# Patient Record
Sex: Female | Born: 1990 | Race: Black or African American | Hispanic: No | Smoking: Never smoker
Health system: Southern US, Community
[De-identification: ages and names within clinical notes are randomized; demographics above are authoritative.]

## PROBLEM LIST (undated history)

## (undated) HISTORY — PX: ECTOPIC PREGNANCY SURGERY: SHX613

---

## 2011-11-29 LAB — COMPREHENSIVE METABOLIC PANEL
Albumin: 3.3 g/dL — ABNORMAL LOW (ref 3.4–5.0)
Anion Gap: 15 (ref 7–16)
BUN: 12 mg/dL (ref 7–18)
Chloride: 106 mmol/L (ref 98–107)
EGFR (African American): 52 — ABNORMAL LOW
Glucose: 208 mg/dL — ABNORMAL HIGH (ref 65–99)
Osmolality: 287 (ref 275–301)
Potassium: 3 mmol/L — ABNORMAL LOW (ref 3.5–5.1)
SGOT(AST): 18 U/L (ref 15–37)
SGPT (ALT): 16 U/L (ref 12–78)
Sodium: 141 mmol/L (ref 136–145)
Total Protein: 7.4 g/dL (ref 6.4–8.2)

## 2011-11-29 LAB — CK TOTAL AND CKMB (NOT AT ARMC)
CK, Total: 171 U/L (ref 21–215)
CK-MB: 1.5 ng/mL (ref 0.5–3.6)

## 2011-11-29 LAB — CBC WITH DIFFERENTIAL/PLATELET
Basophil #: 0.1 10*3/uL (ref 0.0–0.1)
Basophil %: 0.4 %
Basophil %: 0.5 %
Eosinophil #: 0.2 10*3/uL (ref 0.0–0.7)
Eosinophil #: 0 10*3/uL (ref 0.0–0.7)
Eosinophil %: 0.2 %
HCT: 28.1 % — ABNORMAL LOW (ref 35.0–47.0)
HGB: 10.4 g/dL — ABNORMAL LOW (ref 12.0–16.0)
HGB: 8.9 g/dL — ABNORMAL LOW (ref 12.0–16.0)
Lymphocyte #: 4.5 10*3/uL — ABNORMAL HIGH (ref 1.0–3.6)
Lymphocyte #: 2.7 10*3/uL (ref 1.0–3.6)
Lymphocyte %: 19.2 %
MCH: 27.7 pg (ref 26.0–34.0)
MCH: 26.7 pg (ref 26.0–34.0)
MCHC: 32.9 g/dL (ref 32.0–36.0)
MCHC: 31.6 g/dL — ABNORMAL LOW (ref 32.0–36.0)
MCV: 84 fL (ref 80–100)
Monocyte #: 1.1 x10 3/mm — ABNORMAL HIGH (ref 0.2–0.9)
Monocyte #: 1 x10 3/mm — ABNORMAL HIGH (ref 0.2–0.9)
Neutrophil #: 23.2 10*3/uL — ABNORMAL HIGH (ref 1.4–6.5)
Neutrophil %: 74.8 %
Neutrophil %: 85.7 %
Platelet: 333 10*3/uL (ref 150–440)
RDW: 15 % — ABNORMAL HIGH (ref 11.5–14.5)
RDW: 15.1 % — ABNORMAL HIGH (ref 11.5–14.5)

## 2011-11-29 LAB — BASIC METABOLIC PANEL
Calcium, Total: 8.4 mg/dL — ABNORMAL LOW (ref 8.5–10.1)
Co2: 16 mmol/L — ABNORMAL LOW (ref 21–32)
Creatinine: 1.51 mg/dL — ABNORMAL HIGH (ref 0.60–1.30)
EGFR (African American): 56 — ABNORMAL LOW
EGFR (Non-African Amer.): 49 — ABNORMAL LOW
Glucose: 184 mg/dL — ABNORMAL HIGH (ref 65–99)
Sodium: 143 mmol/L (ref 136–145)

## 2011-11-29 LAB — LIPASE, BLOOD: Lipase: 110 U/L (ref 73–393)

## 2011-11-29 LAB — HCG, QUANTITATIVE, PREGNANCY: Beta Hcg, Quant.: 11863 m[IU]/mL — ABNORMAL HIGH

## 2011-11-29 LAB — TROPONIN I: Troponin-I: <0.02 ng/mL

## 2011-11-30 ENCOUNTER — Inpatient Hospital Stay: Payer: Self-pay | Admitting: Obstetrics and Gynecology

## 2011-11-30 LAB — BASIC METABOLIC PANEL
Anion Gap: 9 (ref 7–16)
BUN: 10 mg/dL (ref 7–18)
BUN: 7 mg/dL (ref 7–18)
Calcium, Total: 8 mg/dL — ABNORMAL LOW (ref 8.5–10.1)
Chloride: 111 mmol/L — ABNORMAL HIGH (ref 98–107)
Chloride: 109 mmol/L — ABNORMAL HIGH (ref 98–107)
Co2: 24 mmol/L (ref 21–32)
Creatinine: 0.77 mg/dL (ref 0.60–1.30)
Creatinine: 0.76 mg/dL (ref 0.60–1.30)
EGFR (African American): >60
EGFR (Non-African Amer.): >60
Potassium: 4.4 mmol/L (ref 3.5–5.1)
Potassium: 3.4 mmol/L — ABNORMAL LOW (ref 3.5–5.1)
Sodium: 142 mmol/L (ref 136–145)

## 2011-11-30 LAB — CBC WITH DIFFERENTIAL/PLATELET
Basophil #: 0 10*3/uL (ref 0.0–0.1)
Basophil %: 0.2 %
Eosinophil #: 0 10*3/uL (ref 0.0–0.7)
Eosinophil #: 0 10*3/uL (ref 0.0–0.7)
Eosinophil %: 0.2 %
Lymphocyte #: 1.8 10*3/uL (ref 1.0–3.6)
Lymphocyte #: 2.8 10*3/uL (ref 1.0–3.6)
Lymphocyte %: 9.5 %
Lymphocyte %: 14.6 %
MCH: 28.2 pg (ref 26.0–34.0)
MCH: 27.7 pg (ref 26.0–34.0)
MCHC: 33.2 g/dL (ref 32.0–36.0)
MCV: 84 fL (ref 80–100)
Monocyte #: 1.5 x10 3/mm — ABNORMAL HIGH (ref 0.2–0.9)
Monocyte %: 5.1 %
Neutrophil #: 16 10*3/uL — ABNORMAL HIGH (ref 1.4–6.5)
Neutrophil #: 14.9 10*3/uL — ABNORMAL HIGH (ref 1.4–6.5)
Neutrophil %: 85.3 %
Neutrophil %: 77.1 %
Platelet: 187 10*3/uL (ref 150–440)
Platelet: 160 10*3/uL (ref 150–440)
RBC: 2.93 10*6/uL — ABNORMAL LOW (ref 3.80–5.20)
RBC: 2.34 10*6/uL — ABNORMAL LOW (ref 3.80–5.20)
RDW: 15 % — ABNORMAL HIGH (ref 11.5–14.5)
RDW: 14.9 % — ABNORMAL HIGH (ref 11.5–14.5)
WBC: 18.7 10*3/uL — ABNORMAL HIGH (ref 3.6–11.0)

## 2011-11-30 LAB — DRUG SCREEN, URINE
Amphetamines, Ur Screen: NEGATIVE (ref ?–1000)
Barbiturates, Ur Screen: NEGATIVE (ref ?–200)
Benzodiazepine, Ur Scrn: POSITIVE (ref ?–200)
Cannabinoid 50 Ng, Ur ~~LOC~~: POSITIVE (ref ?–50)
MDMA (Ecstasy)Ur Screen: NEGATIVE (ref ?–500)
Methadone, Ur Screen: NEGATIVE (ref ?–300)
Opiate, Ur Screen: NEGATIVE (ref ?–300)
Phencyclidine (PCP) Ur S: NEGATIVE (ref ?–25)
Tricyclic, Ur Screen: NEGATIVE (ref ?–1000)

## 2011-11-30 LAB — URINALYSIS, COMPLETE
Bilirubin,UR: NEGATIVE
Glucose,UR: NEGATIVE mg/dL (ref 0–75)
Leukocyte Esterase: NEGATIVE
Nitrite: NEGATIVE
Ph: 5 (ref 4.5–8.0)
Protein: NEGATIVE
RBC,UR: 1 /HPF (ref 0–5)
Specific Gravity: 1.015 (ref 1.003–1.030)
Squamous Epithelial: <1

## 2011-12-02 LAB — PATHOLOGY REPORT

## 2013-10-08 ENCOUNTER — Encounter (HOSPITAL_COMMUNITY): Payer: Self-pay | Admitting: Emergency Medicine

## 2013-10-08 DIAGNOSIS — S99929A Unspecified injury of unspecified foot, initial encounter: Secondary | ICD-10-CM

## 2013-10-08 DIAGNOSIS — S99919A Unspecified injury of unspecified ankle, initial encounter: Secondary | ICD-10-CM

## 2013-10-08 DIAGNOSIS — S40029A Contusion of unspecified upper arm, initial encounter: Secondary | ICD-10-CM | POA: Insufficient documentation

## 2013-10-08 DIAGNOSIS — S8000XA Contusion of unspecified knee, initial encounter: Secondary | ICD-10-CM | POA: Insufficient documentation

## 2013-10-08 DIAGNOSIS — Y92410 Unspecified street and highway as the place of occurrence of the external cause: Secondary | ICD-10-CM | POA: Insufficient documentation

## 2013-10-08 DIAGNOSIS — S8990XA Unspecified injury of unspecified lower leg, initial encounter: Secondary | ICD-10-CM | POA: Insufficient documentation

## 2013-10-08 DIAGNOSIS — S20219A Contusion of unspecified front wall of thorax, initial encounter: Secondary | ICD-10-CM | POA: Insufficient documentation

## 2013-10-08 DIAGNOSIS — Y9389 Activity, other specified: Secondary | ICD-10-CM | POA: Insufficient documentation
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
## 2013-10-09 ENCOUNTER — Emergency Department (HOSPITAL_COMMUNITY): Payer: No Typology Code available for payment source

## 2013-10-09 ENCOUNTER — Emergency Department (HOSPITAL_COMMUNITY)
Admission: EM | Admit: 2013-10-09 | Discharge: 2013-10-09 | Disposition: A | Discharge status: Home / Self Care | Payer: No Typology Code available for payment source | Attending: Emergency Medicine | Admitting: Emergency Medicine

## 2013-10-09 DIAGNOSIS — S20212A Contusion of left front wall of thorax, initial encounter: Secondary | ICD-10-CM

## 2013-10-09 DIAGNOSIS — S8001XA Contusion of right knee, initial encounter: Secondary | ICD-10-CM

## 2013-10-09 DIAGNOSIS — S40021A Contusion of right upper arm, initial encounter: Secondary | ICD-10-CM

## 2013-10-09 MED ORDER — OXYCODONE-ACETAMINOPHEN 5-325 MG PO TABS
1.0000 | ORAL_TABLET | Freq: Once | ORAL | Status: AC
  Administered 2013-10-09: 1 via ORAL
  Filled 2013-10-09: qty 1

## 2013-10-09 MED ORDER — OXYCODONE-ACETAMINOPHEN 5-325 MG PO TABS: 1.0000 | ORAL_TABLET | ORAL | Status: DC | PRN

## 2013-10-09 MED ORDER — BACITRACIN ZINC 500 UNIT/GM EX OINT
TOPICAL_OINTMENT | CUTANEOUS | Status: AC
  Filled 2013-10-09: qty 1.8
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
## 2013-10-14 MED FILL — Oxycodone w/ Acetaminophen Tab 5-325 MG: ORAL | Qty: 6 | Status: AC

## 2014-06-13 NOTE — Op Note (Signed)
PATIENT NAME:  [name], Elizabeth Poole DATE OF BIRTH:  [birth year]  DATE OF PROCEDURE:  11/29/2011  PREOPERATIVE DIAGNOSIS: Ruptured ovarian cyst versus ruptured ectopic pregnancy.  POSTOPERATIVE DIAGNOSIS: Ruptured ectopic pregnancy of the right fallopian tube.   ANESTHESIA: General.   SURGEON: Jaelah Hauth G. Knowles-Jonas, MD   ESTIMATED BLOOD LOSS: 500 mL.   COMPLICATIONS: None.   FINDINGS: 500 mL of hemoperitoneum. No active bleeding. 2 cm mass in the midportion of the right fallopian tube.   SPECIMENS REMOVED: Products of conception and partial right salpingectomy.  ASSISTANT: None.   COMPLICATIONS: None.   DESCRIPTION OF PROCEDURE: The patient is a 24 year old female who presented with sudden onset of pain and drop in hematocrit with positive beta-hCG. She was taken to the operating room where she was prepped and draped in the usual sterile fashion in the dorsal supine position. A Pfannenstiel incision was made in the lower abdomen and retractors placed and the bowel was packed away. The ectopic pregnancy was noted to be in the right midportion of the fallopian tube and the area was dissected free by making a window in the broad ligament and doubly clamping on the proximal and distal side of the ectopic pregnancy. This was resected and handed off the field for permanent pathology. The fallopian tube was then suture ligatured with 2-0 Vicryl on the distal and proximal ends and hemostasis was excellent. The abdomen and pelvis was then copiously irrigated and cleansed free of all clots and debris. The dissection site in the right fallopian tube was examined and found to be hemostatic. The uterus was grossly normal as was the left tube, left ovary, and right ovary. The abdomen was then closed in the following fashion. The fascia was closed using 0 Vicryl in a running fashion starting at one end, closing in the middle, tying in the middle, and then starting at the opposite end and tying over  the middle. The subcutaneous tissues were copiously irrigated and the skin was closed with staples. A sterile bandage was placed.   The patient was awakened from anesthesia without difficulty and returned to the recovery room in stable condition. She was admitted to the hospital for further observation in stable condition.   ____________________________ Beryle QuantLynde G. Toya SmothersKnowles-Jonas, MD [id number] D: 12/08/2011 12:12:57 ET T: 12/08/2011 12:30:18 ET JOB#: [id number]  cc: Beryle QuantLynde G. Toya SmothersKnowles-Jonas, MD, <Dictator> Kayley Zeiders Toya SmothersKNOWLES JONAS MD ELECTRONICALLY SIGNED 12/08/2011 22:52

## 2014-06-13 NOTE — H&P (Signed)
Subjective/Chief Complaint possible ectopic pelvic pain    History of Present Illness 24yo G1P0 presents with pelvic pain.  Hcg [id number]. Pain severe this afternoon, became diaphoretic, brought to [facility] via ambulance, u/s shows pelvic mass and echogenic fluid    Past History PMH negative. Non smoker. No prior surgeries.    Primary Physician nne   ALLERGIES:  No Known Allergies:   Family and Social History:   Family History Non-Contributory    Social History negative tobacco, negative ETOH, negative Illicit drugs    Place of Living Home   Review of Systems:   Subjective/Chief Complaint pelvic pain    Fever/Chills No    Cough No    Sputum No    Abdominal Pain Yes    Diarrhea No    Constipation No    Nausea/Vomiting No    SOB/DOE No    Chest Pain No    Telemetry Reviewed EKG wnl    Dysuria No    Tolerating Diet Nauseated   Physical Exam:   GEN well developed, well nourished    HEENT PERRL, hearing intact to voice, Oropharynx clear, good dentition    NECK supple  No masses    RESP normal resp effort    CARD regular rate    ABD positive tenderness  no liver/spleen enlargement  no hernia  soft  hypoactive BS    GU foley catheter in place    LYMPH negative neck, negative axillae    EXTR negative cyanosis/clubbing, negative edema    SKIN No rashes, No ulcers    NEURO cranial nerves intact    PSYCH alert, A+O to time, place, person, good insight   Lab Results: Hepatic:  05-Oct-13 18:02    Bilirubin, Total 0.2   Alkaline Phosphatase 76   SGPT (ALT) 16   SGOT (AST) 18   Total Protein, Serum 7.4   Albumin, Serum  3.3  Routine Chem:  05-Oct-13 18:02    Glucose, Serum  184   Glucose, Serum  208   BUN 15   BUN 12   Creatinine (comp)  1.51   Creatinine (comp)  1.61   Sodium, Serum 143   Sodium, Serum 141   Potassium, Serum 3.6   Potassium, Serum  3.0   Chloride, Serum  110   Chloride, Serum 106   CO2, Serum  16   CO2, Serum  20    Calcium (Total), Serum  8.4   Calcium (Total), Serum 9.1   Anion Gap  17   Anion Gap 15   Osmolality (calc) 291   Osmolality (calc) 287   eGFR (African American)  56   eGFR (African American)  52   eGFR (Non-African American)  49 (eGFR values <37m/min/1.73 m2 may be an indication of chronic kidney disease (CKD). Calculated eGFR is useful in patients with stable renal function. The eGFR calculation will not be reliable in acutely ill patients when serum creatinine is changing rapidly. It is not useful in  patients on dialysis. The eGFR calculation may not be applicable to patients at the low and high extremes of body sizes, pregnant women, and vegetarians.)   eGFR (Non-African American)  45 (eGFR values <661mmin/1.73 m2 may be an indication of chronic kidney disease (CKD). Calculated eGFR is useful in patients with stable renal function. The eGFR calculation will not be reliable in acutely ill patients when serum creatinine is changing rapidly. It is not useful in  patients on dialysis. The eGFR calculation may not be  applicable to patients at the low and high extremes of body sizes, pregnant women, and vegetarians.)   Lipase 110 (Result(s) reported on 29 Nov 2011 at 06:48PM.)   HCG Betasubunit Quant. Serum  11863 (1-3  (International Unit)  ----------------- Non-pregnant <5 Weeks Post LMP mIU/mL  3- 4 wk 9 - 130  4- 5 wk 75 - 2,600  5- 6 wk 850 - 20,800  6- 7 wk 4,000 - 100,000  7-12 wk 11,500 - 289,000 12-16 wk 18,000 - 137,000 16-29 wk 1,400 - 53,000 29-41 wk 940 - 60,000)   Magnesium, Serum 1.8 (1.8-2.4 THERAPEUTIC RANGE: 4-7 mg/dL TOXIC: > 10 mg/dL  -----------------------)  Cardiac:  05-Oct-13 18:02    Troponin I < 0.02 (0.00-0.05 0.05 ng/mL or less: NEGATIVE  Repeat testing in 3-6 hrs  if clinically indicated. >0.05 ng/mL: POTENTIAL  MYOCARDIAL INJURY. Repeat  testing in 3-6 hrs if  clinically indicated. NOTE: An increase or decrease  of 30% or more on  serial  testing suggests a  clinically important change)   CK, Total 171   CPK-MB, Serum 1.5 (Result(s) reported on 29 Nov 2011 at 06:48PM.)  Routine Hem:  05-Oct-13 18:02    WBC (CBC)  27.0   WBC (CBC)  23.4   RBC (CBC)  3.34   RBC (CBC)  3.74   Hemoglobin (CBC)  8.9   Hemoglobin (CBC)  10.4   Hematocrit (CBC)  28.1   Hematocrit (CBC)  31.5   Platelet Count (CBC) 278   Platelet Count (CBC) 333   MCV 84   MCV 84   MCH 26.7   MCH 27.7   MCHC  31.6   MCHC 32.9   RDW  15.1   RDW  15.0   Neutrophil % 85.7   Neutrophil % 74.8   Lymphocyte % 9.9   Lymphocyte % 19.2   Monocyte % 3.7   Monocyte % 4.9   Eosinophil % 0.2   Eosinophil % 0.7   Basophil % 0.5   Basophil % 0.4   Neutrophil #  23.2   Neutrophil #  17.5   Lymphocyte # 2.7   Lymphocyte #  4.5   Monocyte #  1.0   Monocyte #  1.1   Eosinophil # 0.0   Eosinophil # 0.2   Basophil # 0.1 (Result(s) reported on 29 Nov 2011 at 09:00PM.)   Basophil # 0.1 (Result(s) reported on 29 Nov 2011 at [facility].)   Radiology Results: XRay:    05-Oct-13 20:34, Chest Portable Single View   Chest Portable Single View   REASON FOR EXAM:    abd pain  COMMENTS:       PROCEDURE: DXR - DXR PORTABLE CHEST SINGLE VIEW  - Nov 29 2011  8:34PM     RESULT: Comparison: None    Findings:     Single portable AP chest radiograph is provided.  There is no focal   parenchymal opacity, pleural effusion, or pneumothorax. Normal   cardiomediastinal silhouette. The osseous structures are unremarkable.    IMPRESSION:     No acute disease of the chest.    Dictation Site: 1          Verified By: Jennette Banker, M.D., MD    05-Oct-13 23:21, Chest Portable Single View   Chest Portable Single View   REASON FOR EXAM:    high peak air way pressures  COMMENTS:       PROCEDURE: DXR - DXR PORTABLE CHEST SINGLE VIEW  -  Nov 29 2011 11:21PM     RESULT: Comparison made to prior study of 11/29/2011. Endotracheal tube in   good position. Lungs  clear. Heart normal. No evidence of pneumothorax.    IMPRESSION:  Endotracheal tube in good position. No acute abnormality   noted.          Verified By: Osa Craver, M.D., MD  [place]:    05-Oct-13 19:59, US OB Less Than 14 Weeks   US OB Less Than 14 Weeks   REASON FOR EXAM:    Abdominal pain,  COMMENTS:   May transport without cardiac monitor    PROCEDURE: [place]  - US OB LESS THAN 14 WEEKS  - Nov 29 2011  7:59PM     RESULT: Size, shape, and echotexture of the uterus is normal. Endometrial   stripe is normal. A large amount of dense fluid noted about the uterus   and throughout the abdomen including below the liver. This is worrisome   for intraabdominal hemorrhage. Given the patient's positive pregnancy   test, this is worrisome for ruptured ectopic pregnancy and gynecologic   emergent consultation suggested. Ovaries not identified. No   hydronephrosis.    IMPRESSION:  Findings worrisome for intra-abdominal and pelvic hemorrhage   possibly from ruptured ectopic pregnancy as described above. Report     phoned to patient's physician at time of study.          Verified By: Osa Craver, M.D., MD     Assessment/Admission Diagnosis Pelvic pain Positive hcg possible ectopic    Plan to or     Admission/Visit Status,  Observation; -  Dr.'s Service: Riley Churches   Preferred Unit: Ob/Gyn   Diagnosis: Ruptured Ectopic/Hemmarhagic Cyst, 633.90 Ruptured ectopic pregnancy-Condition:Stable  Date of Admission: 29-Nov-2011, 29-Nov-2011, Active, Standard   *Discharge patient from PACU,  when PARS = 10 and Patient has been evaluated by Anesthesia, 29-Nov-2011, Completed, Standard   Basic Metabolic Panel (w/Total Calcium), STAT  Draw Date:29-Nov-2011, 29-Nov-2011, 1 or more Final Results Received, Standard   Blood Group and Rh, STAT  Draw Date:29-Nov-2011, 29-Nov-2011, Corrected Results, Standard   Cardiac Panel, STAT  Draw Date:29-Nov-2011  Includes CK Total and CPK-MB,  29-Nov-2011, 1 or more Final Results Received, Standard   CBC Profile, STAT  Draw Date:29-Nov-2011, 29-Nov-2011, 1 or more Final Results Received, Standard   CBC Profile, STAT  Draw Date:29-Nov-2011, 29-Nov-2011, 1 or more Final Results Received, Standard   Comprehensive Metabolic Panel, STAT  Draw Date:29-Nov-2011, 29-Nov-2011, 1 or more Final Results Received, Standard   HCG Betasubunit Quant. Serum, STAT  Draw Date:29-Nov-2011, 29-Nov-2011, 1 or more Final Results Received, Standard   Lipase, STAT  Draw Date:29-Nov-2011, 29-Nov-2011, 1 or more Final Results Received, Standard   Magnesium, Serum, STAT  Draw Date:29-Nov-2011, 29-Nov-2011, 1 or more Final Results Received, Standard   Troponin I, STAT  Draw Date:29-Nov-2011, 29-Nov-2011, 1 or more Final Results Received, Standard   Packed Cells _unit(s) avail at all times, # of Unit(s):4 This does not route to lab.  Lab requires type and screen, crossmatch, and Packed Cells trans fee., 29-Nov-2011, Active, Standard   Crossmatch 4 Units, STAT-Transfusion indication: Require blood STAT - Acute hemorrhage., 29-Nov-2011, Corrected Results, Standard   Type and Antibody Screen, Routine, 29-Nov-2011, Corrected Results, Standard   Basic Metabolic Panel (w/Total Calcium), Routine  Draw Date:30-Nov-2011, 30-Nov-2011, Active, Standard   CBC Profile, Routine  Draw Date:30-Nov-2011, 30-Nov-2011, Active, Standard   Pregnancy Test, Urine, STAT  Draw Date:29-Nov-2011, 29-Nov-2011, Cancelled, Standard  Urinalysis, STAT  Date to [place], 29-Nov-2011, Pending Collection, Standard   Urine Drug Screen, Qual, STAT  Draw Date:29-Nov-2011, 29-Nov-2011, Pending Collection, Standard   Sodium Chloride 0.9%, 1000 ml at 999 ml/hr, Stop After: 1 Doses, 29-Nov-2011, Completed, Standard   Lactated Ringers, 1000 ml at 999 ml/hr, Stop After: 1 Doses, 29-Nov-2011, Completed, Standard   Sodium Chloride 0.9%, 250 ml at 999 ml/hr, Instructions:  For blood administration-Per Policy, Stop After: 1 Doses, 29-Nov-2011, Completed, Standard   Lactated Ringers, 1000 ml at 125 ml/hr, 30-Nov-2011, Active, Standard   Potassium Chloride injection, 20 mEq in Sodium Chloride 0.9% 250 ml, IV Piggyback, once, Infuse over 2 hour(s)  Indication: Hypokalemia, 29-Nov-2011, Active, Standard   Ondansetron injection,  ( Zofran injection )  4 mg, IV push, once  Indication: Nausea/ Vomiting, 29-Nov-2011, Completed, Standard   Ondansetron injection,  ( Zofran injection )  4 mg, IV push, once  Indication: Nausea/ Vomiting, 29-Nov-2011, Completed, Standard   fentaNYL injection, ( Sublimaze injection )  12.5 to 25 mcg, IV push, every 5 - 10 minute(s) PRN for pain  Indication: Pain, -MAX TOTAL DOSAGE:    100   mcg.  PACU Only-------If pain is SEVERE and remains unresponsive to above, may increase MAX dosage to a TOTAL of 200 mcg. Contact md if pain level remains uncontrolled  [Med Admin Window: 30 mins before or after scheduled dose], 29-Nov-2011, Active, Standard   Meperidine injection, ( Demerol injection )  12.5 mg, IV push, every 5 - 10 minute(s) PRN for pain  Indication: Pain, PACU: {For SEVERE pain unresponsive to Fentanyl.} Demerol Max 50 mg.------- If pain remains SEVERE and unresponsive to above, may increase Demerol to a MAX of 100 mg Total.  [Med Admin Window: 30 mins before or after scheduled dose], 29-Nov-2011, Active, Standard   Ondansetron injection, ( Zofran injection )  4 mg, IV push, once PRN for nausea, vomiting.  Stop After:  1 Doses  Indication: Nausea, PACU Only, 29-Nov-2011, Active, Standard   MorphINE PCA, (1 mg/ml), PCA Dose: 1 mg, Lock Out Interval: 6 minutes, Four Hour Limit: 20 mg, Loading Dose: 0 mg, [Med Admin Window: 30 min before or after scheduled dose], 30-Nov-2011, Active, Standard   Ondansetron injection,  ( Zofran injection )  4 mg, IV push, q12h PRN for nausea  Indication: Nausea/Vomiting, 30-Nov-2011, Active,  Standard   US OB Less Than 14 Weeks w/ Transvaginal, Stat-STAT-Abdominal pain,; EXAM IN PROGRESS. DO NOT CANCEL.  May transport without cardiac monitor**After hours/weekends/holidays - the ordering physician must contact the Radiologist on call to have the study performed, 29-Nov-2011, Cancelled by Performing Department, Standard   US OB Less Than 14 Weeks, 18:44-STAT-Abdominal pain,, 29-Nov-2011, 1 or more Final Results Received, Standard   Chest Portable Single View, STAT-abd pain, 29-Nov-2011, 1 or more Final Results Received, Standard   Chest Portable Single View, Routine-high peak air way pressures, 29-Nov-2011, 1 or more Final Results Received, Standard   ED ECG, Stat-Interpreted by-.ordering physician-Reason for ecg -weakness, 29-Nov-2011, Completed, Standard   Oxygen per nasal cannula for sat <:, -Liters per [place]:2-if sat <:92-May discontinue in am if sat > 90, 29-Nov-2011, Active, Standard   PACU Oxygen, face tent  Liters per min: 15-Humidified  if patient has had General Anesthesia. Simple face mask may also be used., 29-Nov-2011, Completed Activation, Standard   PACU Oxygen, nasal cannula  Liters per min: 1-5  if patient has had MAC anesthesia., 29-Nov-2011, Available for Activation, Standard   PACU Oxygen, nasal cannula  Liters per min: 1-5  if patient has had Regional Anesthesia., 29-Nov-2011, Available for Activation, Standard   PACU Oxygen, face tent  Liters per min: 15-Humidified  if patient has had General Anesthesia. Simple face mask may also be used., 30-Nov-2011, Active, Standard   Clear Liquid Diet, Advance diet as tolerated to: Regular, 30-Nov-2011, Active, Standard   Pneumatic/Compression Hose, 30-Nov-2011, Active, Standard   Incentive Spirometry Q2hr W/A, Initiate Respiratory Therapy Protocol, 30-Nov-2011, Active, Standard   Foley, For Monitoring urinary output/hemodynamic instability, 29-Nov-2011, Active, Standard   In & Out Cath, prn for inability to  void/discomfort-As Spinal/Epidural Subsides., 29-Nov-2011, Active, Standard   Document:, urine output post catheter insertion on the I & O flowsheet, 29-Nov-2011, Active, Standard   Obtain Documentation of Consent for blood transfusion, 29-Nov-2011, Pending, Standard  Electronic Signatures: Elgie Collard (MD)  (Signed 06-Oct-13 02:46)  Authored: CHIEF COMPLAINT and HISTORY, ALLERGIES, FAMILY AND SOCIAL HISTORY, REVIEW OF SYSTEMS, PHYSICAL EXAM, LABS, Radiology, ASSESSMENT AND PLAN, Orders   Last Updated: 06-Oct-13 02:46 by Elgie Collard (MD)

## 2016-05-03 ENCOUNTER — Encounter (HOSPITAL_COMMUNITY): Payer: Self-pay | Admitting: Emergency Medicine

## 2016-05-03 ENCOUNTER — Emergency Department (HOSPITAL_COMMUNITY): Payer: Self-pay

## 2016-05-03 ENCOUNTER — Emergency Department (HOSPITAL_COMMUNITY)
Admission: EM | Admit: 2016-05-03 | Discharge: 2016-05-03 | Disposition: A | Discharge status: Home / Self Care | Payer: Self-pay | Attending: Emergency Medicine | Admitting: Emergency Medicine

## 2016-05-03 DIAGNOSIS — Y939 Activity, unspecified: Secondary | ICD-10-CM | POA: Insufficient documentation

## 2016-05-03 DIAGNOSIS — Y929 Unspecified place or not applicable: Secondary | ICD-10-CM | POA: Insufficient documentation

## 2016-05-03 DIAGNOSIS — W108XXA Fall (on) (from) other stairs and steps, initial encounter: Secondary | ICD-10-CM | POA: Insufficient documentation

## 2016-05-03 DIAGNOSIS — S8991XA Unspecified injury of right lower leg, initial encounter: Secondary | ICD-10-CM | POA: Insufficient documentation

## 2016-05-03 DIAGNOSIS — Y999 Unspecified external cause status: Secondary | ICD-10-CM | POA: Insufficient documentation

## 2016-05-03 MED ORDER — IBUPROFEN 600 MG PO TABS: 600.0000 mg | ORAL_TABLET | Freq: Four times a day (QID) | ORAL | 0 refills | Status: AC | PRN

## 2016-05-03 MED ORDER — HYDROCODONE-ACETAMINOPHEN 5-325 MG PO TABS
1.0000 | ORAL_TABLET | Freq: Once | ORAL | Status: AC
  Administered 2016-05-03: 1 via ORAL
  Filled 2016-05-03: qty 1

## 2016-05-03 MED ORDER — HYDROCODONE-ACETAMINOPHEN 5-325 MG PO TABS: 1.0000 | ORAL_TABLET | ORAL | 0 refills | Status: AC | PRN

## 2016-05-03 NOTE — ED Provider Notes (Signed)
[facility]-EMERG[facility]Y DEPT Provider Note   CSN: [id number] Arrival date & time: 05/03/16  0944     History   Chief Complaint Chief Complaint  Patient presents with  . Knee Injury    HPI Elizabeth Poole is a 26 y.o. female with no significant past medical history presenting with persistent Right medial knee pain and swelling since tripping and falling down 4 steps 2 days ago.  She has been applying ice, using elevation and rest in addition has taken Tylenol with no significant improvement in her symptoms.  She endorses episodes of a popping sensation with certain movements at her medial knee space.  She denies weakness or numbness distal to the injury site and denies any other injuries from this fall.  The history is provided by the patient.    History reviewed. No pertinent past medical history.  There are no active problems to display for this patient.   Past Surgical History:  Procedure Laterality Date  . ECTOPIC PREGNANCY SURGERY      OB History    No data available       Home Medications    Prior to Admission medications   Medication Sig Start Date End Date Taking? Authorizing Provider  HYDROcodone-acetaminophen (NORCO/VICODIN) 5-325 MG tablet Take 1 tablet by mouth every 4 (four) hours as needed. 05/03/16   Burgess Amor, PA-C  ibuprofen (ADVIL,MOTRIN) 600 MG tablet Take 1 tablet (600 mg total) by mouth every 6 (six) hours as needed. 05/03/16   Burgess Amor, PA-C    Family History History reviewed. No pertinent family history.  Social History Social History  Substance Use Topics  . Smoking status: Never Smoker  . Smokeless tobacco: Never Used  . Alcohol use Yes     Comment: every few days     Allergies   Patient has no known allergies.   Review of Systems Review of Systems  Constitutional: Negative for fever.  Musculoskeletal: Positive for arthralgias and joint swelling. Negative for myalgias.  Neurological: Negative for weakness and numbness.     Physical  Exam Updated Vital Signs BP 123/82 (BP Location: Left Arm)   Pulse 81   Temp 98.2 F (36.8 C) (Oral)   Resp 18   Ht 5\' 8"  (1.727 m)   Wt 101.6 kg   LMP 04/08/2016   SpO2 100%   BMI 34.06 kg/m   Physical Exam  Constitutional: She appears well-developed and well-nourished.  HENT:  Head: Atraumatic.  Neck: Normal range of motion.  Cardiovascular:  Pulses equal bilaterally  Musculoskeletal: She exhibits tenderness.       Right knee: She exhibits swelling. She exhibits no effusion, no ecchymosis, no deformity, no erythema, normal alignment, no LCL laxity and no MCL laxity. Tenderness found. Medial joint line tenderness noted.  Increased pain with varus right knee strain. Also ttp quadricep tendon, distally, no palpable deformity.  Pt can SLR with knee in extension.  Neurological: She is alert. She has normal strength. She displays normal reflexes. No sensory deficit.  Skin: Skin is warm and dry.  Psychiatric: She has a normal mood and affect.     ED Treatments / Results  Labs (all labs ordered are listed, but only abnormal results are displayed) Labs Reviewed  POC URINE PREG, ED    EKG  EKG Interpretation None       Radiology Dg Knee Complete 4 Views Right  Result Date: 05/03/2016 CLINICAL DATA:  Fall EXAM: RIGHT KNEE - COMPLETE 4+ VIEW COMPARISON:  10/09/2013 FINDINGS: No fracture or  dislocation is seen. The joint spaces are preserved. Visualized soft tissues are within normal limits. No suprapatellar knee joint effusion. IMPRESSION: Negative. Electronically Signed   By: Charline BillsSriyesh  Krishnan M.D.   On: 05/03/2016 11:25    Procedures Procedures (including critical care time)  Medications Ordered in ED Medications  HYDROcodone-acetaminophen (NORCO/VICODIN) 5-325 MG per tablet 1 tablet (1 tablet Oral Given 05/03/16 1115)     Initial Impression / Assessment and Plan / ED Course  I have reviewed the triage vital signs and the nursing notes.  Pertinent labs & imaging  results that were available during my care of the patient were reviewed by me and considered in my medical decision making (see chart for details).     Knee pain from fall with no acute plain film findings.  Concern for medial collateral ligament strain versus medial meniscal injury.  She was placed in a knee sleeve, crutches given, encouraged ice, elevation and rest.  Ortho referrals given.  [place] controlled substance database reviewed.   Final Clinical Impressions(s) / ED Diagnoses   Final diagnoses:  Injury of right knee, initial encounter    New Prescriptions New Prescriptions   HYDROCODONE-ACETAMINOPHEN (NORCO/VICODIN) 5-325 MG TABLET    Take 1 tablet by mouth every 4 (four) hours as needed.   IBUPROFEN (ADVIL,MOTRIN) 600 MG TABLET    Take 1 tablet (600 mg total) by mouth every 6 (six) hours as needed.     Burgess AmorJulie Steffen Hase, PA-C 05/03/16 1201    Vanetta MuldersScott Zackowski, MD 05/08/16 [phone]

## 2016-05-03 NOTE — ED Triage Notes (Signed)
Pt reports falling down steps a few days ago and having pain on medial aspect of right knee since then.

## 2016-05-03 NOTE — Discharge Instructions (Signed)
Wear the knee sleeve and use crutches as needed for comfort.  Use ice and elevation as much as possible for the next several days to help reduce the swelling.  Take the medications prescribed.  You may take the hydrocodone prescribed for pain relief.  This will make you drowsy - do not drive within 4 hours of taking this medication.  Use the ibuprofen also for inflammation.  Call the orthopedic doctor listed for a recheck of your injury. Your xrays are negative today.

## 2016-05-07 ENCOUNTER — Emergency Department (HOSPITAL_COMMUNITY)
Admission: EM | Admit: 2016-05-07 | Discharge: 2016-05-07 | Disposition: A | Discharge status: Home / Self Care | Payer: Self-pay | Attending: Emergency Medicine | Admitting: Emergency Medicine

## 2016-05-07 ENCOUNTER — Encounter (HOSPITAL_COMMUNITY): Payer: Self-pay | Admitting: *Deleted

## 2016-05-07 DIAGNOSIS — W19XXXD Unspecified fall, subsequent encounter: Secondary | ICD-10-CM | POA: Insufficient documentation

## 2016-05-07 DIAGNOSIS — S8391XD Sprain of unspecified site of right knee, subsequent encounter: Secondary | ICD-10-CM | POA: Insufficient documentation

## 2016-05-07 DIAGNOSIS — S838X1D Sprain of other specified parts of right knee, subsequent encounter: Secondary | ICD-10-CM

## 2016-05-07 MED ORDER — DICLOFENAC SODIUM 50 MG PO TBEC: 50.0000 mg | DELAYED_RELEASE_TABLET | Freq: Two times a day (BID) | ORAL | 0 refills | Status: AC

## 2016-05-07 MED ORDER — KETOROLAC TROMETHAMINE 60 MG/2ML IM SOLN
30.0000 mg | Freq: Once | INTRAMUSCULAR | Status: AC
  Administered 2016-05-07: 30 mg via INTRAMUSCULAR
  Filled 2016-05-07: qty 2

## 2016-05-07 MED ORDER — METHOCARBAMOL 500 MG PO TABS: 500.0000 mg | ORAL_TABLET | Freq: Two times a day (BID) | ORAL | 0 refills | Status: AC

## 2016-05-07 MED ORDER — KETOROLAC TROMETHAMINE 30 MG/ML IJ SOLN: 30.0000 mg | Freq: Once | INTRAMUSCULAR | Status: DC

## 2016-05-07 NOTE — Discharge Instructions (Signed)
Schedule to see an Orthopaedist for evaluation  Gavin Potters([facility])

## 2016-05-07 NOTE — ED Provider Notes (Signed)
[facility] Provider Note   CSN: [id number] Arrival date & time: 05/07/16  2035     History   Chief Complaint Chief Complaint  Patient presents with  . Knee Injury    HPI Elizabeth Poole is a 26 y.o. female.  The history is provided by the patient. No language interpreter was used.  Pt reports she injured her knee several days ago.  Pt was seen here 4 days ago.  Pt reports pain is worse.  Pt feels like knee is giving away.  Pt has not scheduled with orthopaedist.  [place]. Pt reports no improvement with pain medication  History reviewed. No pertinent past medical history.  There are no active problems to display for this patient.   Past Surgical History:  Procedure Laterality Date  . ECTOPIC PREGNANCY SURGERY      OB History    No data available       Home Medications    Prior to Admission medications   Medication Sig Start Date End Date Taking? Authorizing Provider  diclofenac (VOLTAREN) 50 MG EC tablet Take 1 tablet (50 mg total) by mouth 2 (two) times daily. 05/07/16   Elson AreasLeslie K Carina Chaplin, PA-C  HYDROcodone-acetaminophen (NORCO/VICODIN) 5-325 MG tablet Take 1 tablet by mouth every 4 (four) hours as needed. 05/03/16   Burgess AmorJulie Idol, PA-C  ibuprofen (ADVIL,MOTRIN) 600 MG tablet Take 1 tablet (600 mg total) by mouth every 6 (six) hours as needed. 05/03/16   Burgess AmorJulie Idol, PA-C  methocarbamol (ROBAXIN) 500 MG tablet Take 1 tablet (500 mg total) by mouth 2 (two) times daily. 05/07/16   Elson AreasLeslie K Rayvin Abid, PA-C    Family History History reviewed. No pertinent family history.  Social History Social History  Substance Use Topics  . Smoking status: Never Smoker  . Smokeless tobacco: Never Used  . Alcohol use Yes     Comment: every few days     Allergies   Patient has no known allergies.   Review of Systems Review of Systems  All other systems reviewed and are negative.    Physical Exam Updated Vital Signs BP 140/84 (BP Location:  Left Arm)   Pulse 90   Temp 97.8 F (36.6 C) (Oral)   Resp 18   LMP 04/08/2016   SpO2 99%   Physical Exam  Constitutional: She is oriented to person, place, and time. She appears well-developed and well-nourished.  Musculoskeletal: She exhibits tenderness. She exhibits no edema or deformity.  Neurological: She is alert and oriented to person, place, and time.  Skin: Skin is warm.  Psychiatric: She has a normal mood and affect.  Vitals reviewed.    ED Treatments / Results  Labs (all labs ordered are listed, but only abnormal results are displayed) Labs Reviewed - No data to display  EKG  EKG Interpretation None       Radiology No results found.  Procedures Procedures (including critical care time)  Medications Ordered in ED Medications  ketorolac (TORADOL) 30 MG/ML injection 30 mg (not administered)  ketorolac (TORADOL) injection 30 mg (30 mg Intramuscular Given 05/07/16 2218)    Pt given torodol for pain, placed in a knee immbolizer. I advised her to follow up with Orthopedist of her choice.  Gavin Potters([facility]) I advised [facility] do not take ED referrals from here. I will try voltaren and robaxin for discomfort   Xray reviewed  Initial Impression / Assessment and Plan / ED Course  I have reviewed the triage vital signs  and the nursing notes.  Pertinent labs & imaging results that were available during my care of the patient were reviewed by me and considered in my medical decision making (see chart for details).       Final Clinical Impressions(s) / ED Diagnoses   Final diagnoses:  Sprain of other ligament of right knee, subsequent encounter    New Prescriptions New Prescriptions   DICLOFENAC (VOLTAREN) 50 MG EC TABLET    Take 1 tablet (50 mg total) by mouth 2 (two) times daily.   METHOCARBAMOL (ROBAXIN) 500 MG TABLET    Take 1 tablet (500 mg total) by mouth 2 (two) times daily.  An After Visit Summary was printed and given to the patient.     Elson Areas, PA-C 05/07/16 2223    Marily Memos, MD 05/07/16 [phone]

## 2016-05-07 NOTE — ED Triage Notes (Signed)
Pt c/o right knee pain after falling on it; pt was seen here x 4 days ago for same complaint

## 2018-09-24 IMAGING — DX DG KNEE COMPLETE 4+V*R*
4 series · 4 of 4 positions shown · non-contrast
Comparison: 10/09/2013

CLINICAL DATA: Fall

EXAM:
RIGHT KNEE - COMPLETE 4+ VIEW

[knee ap]
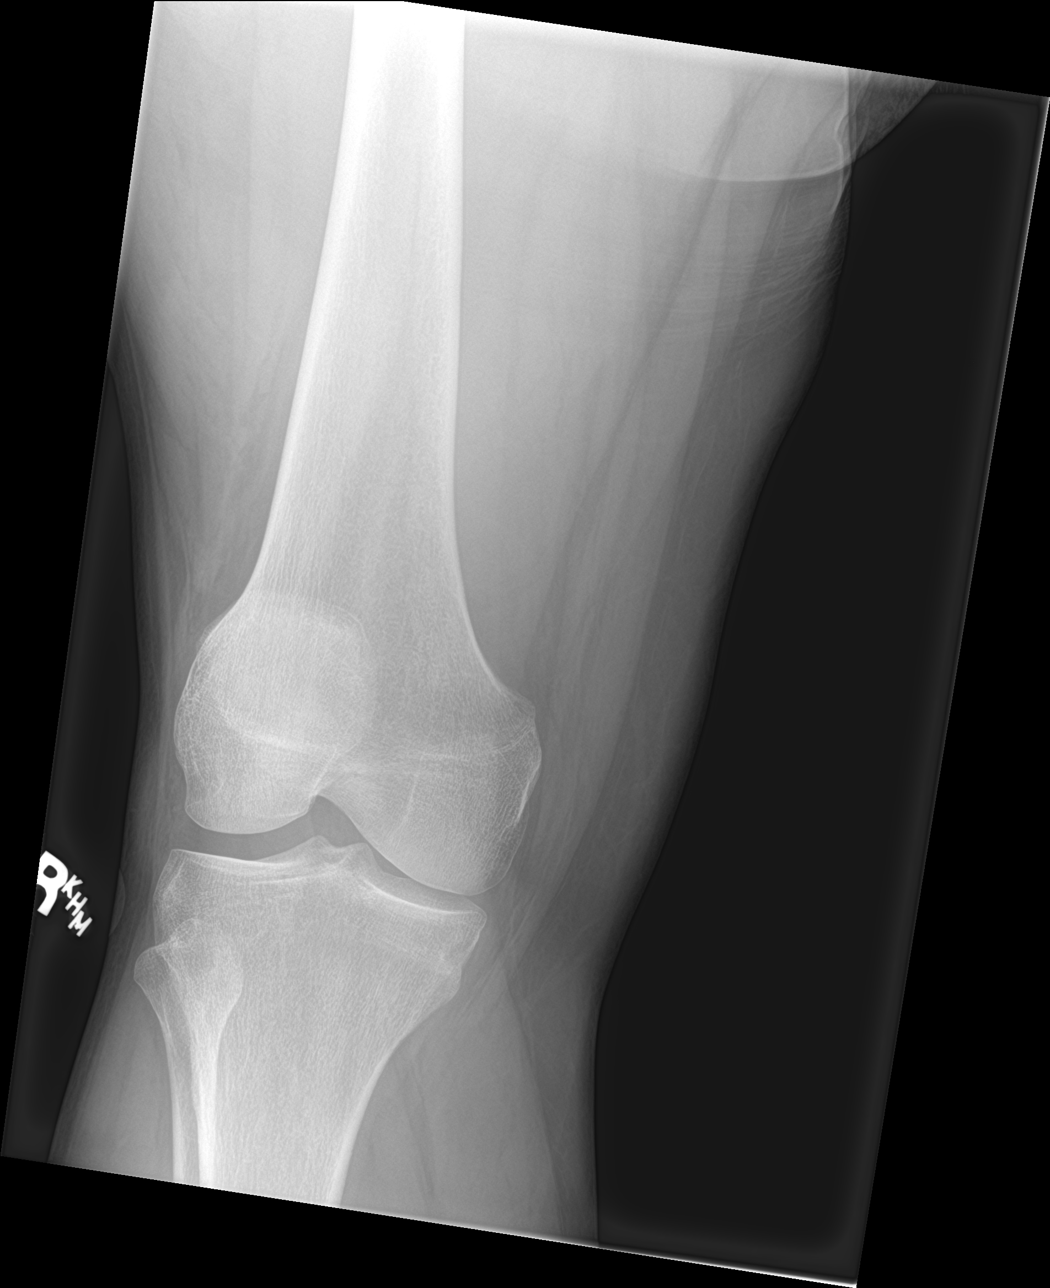

[tunnel]
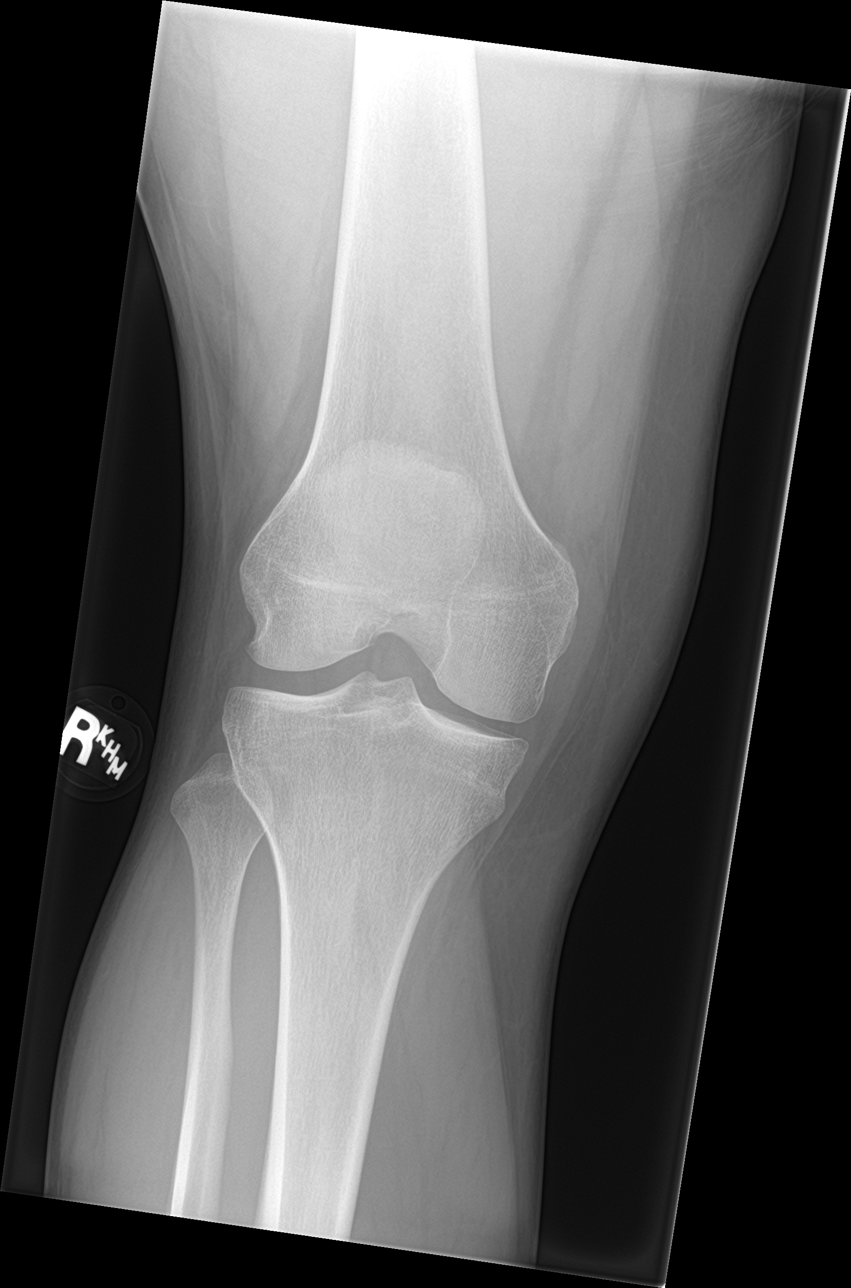

[knee lat]
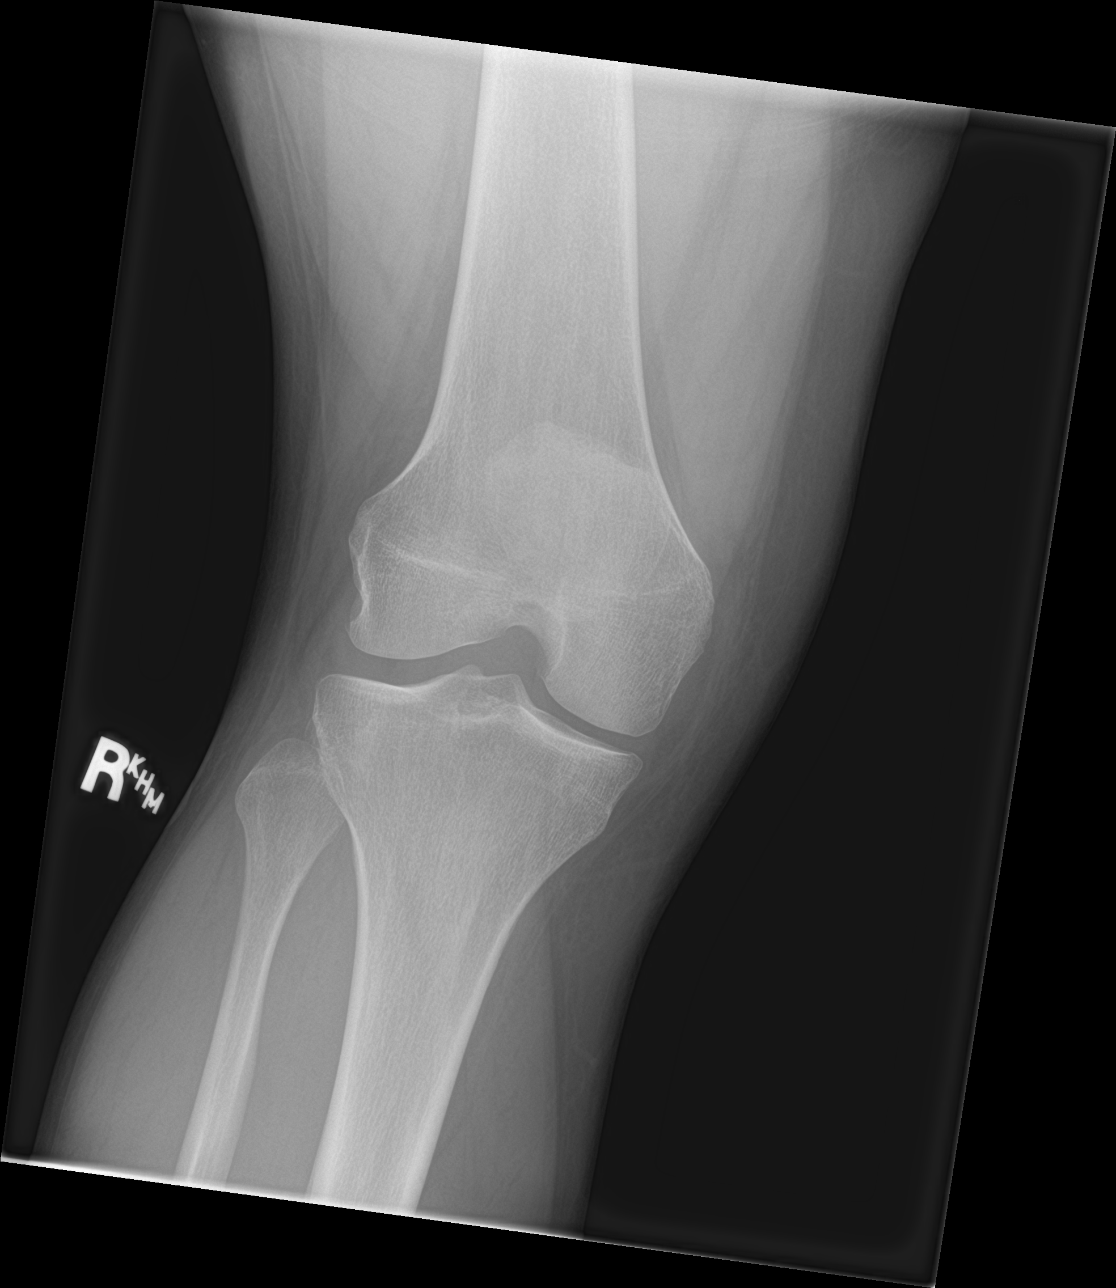

[knee sunrise]
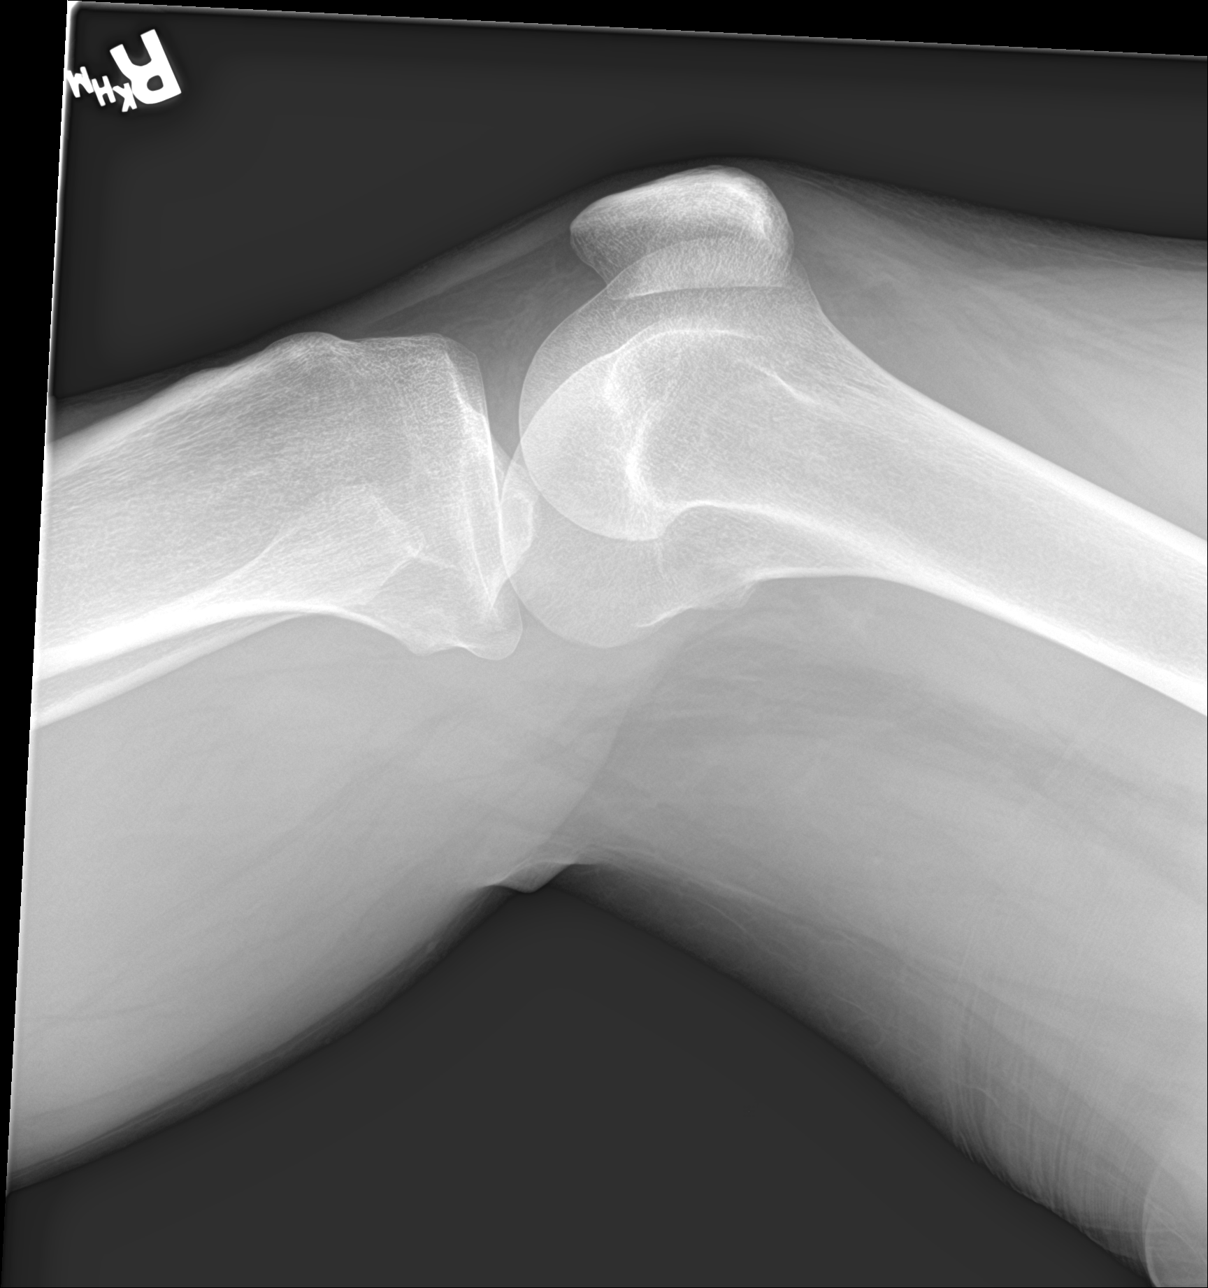

[4 of 4 positions shown; findings below may reference images not displayed]

FINDINGS: No fracture or dislocation is seen.

The joint spaces are preserved.

Visualized soft tissues are within normal limits.

No suprapatellar knee joint effusion.
IMPRESSION: Negative.

## 2019-08-25 ENCOUNTER — Emergency Department (HOSPITAL_COMMUNITY): Payer: Self-pay

## 2019-08-25 ENCOUNTER — Emergency Department (HOSPITAL_COMMUNITY)
Admission: EM | Admit: 2019-08-25 | Discharge: 2019-08-25 | Disposition: A | Discharge status: Home / Self Care | Payer: Self-pay | Attending: Emergency Medicine | Admitting: Emergency Medicine

## 2019-08-25 ENCOUNTER — Other Ambulatory Visit: Payer: Self-pay

## 2019-08-25 ENCOUNTER — Encounter (HOSPITAL_COMMUNITY): Payer: Self-pay | Admitting: Emergency Medicine

## 2019-08-25 DIAGNOSIS — Y92481 Parking lot as the place of occurrence of the external cause: Secondary | ICD-10-CM | POA: Insufficient documentation

## 2019-08-25 DIAGNOSIS — Y9389 Activity, other specified: Secondary | ICD-10-CM | POA: Insufficient documentation

## 2019-08-25 DIAGNOSIS — S0181XA Laceration without foreign body of other part of head, initial encounter: Secondary | ICD-10-CM | POA: Insufficient documentation

## 2019-08-25 DIAGNOSIS — Y999 Unspecified external cause status: Secondary | ICD-10-CM | POA: Insufficient documentation

## 2019-08-25 NOTE — ED Notes (Signed)
Called for pt to CT and VS recheck x3, no response.
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
## 2020-02-26 ENCOUNTER — Emergency Department (HOSPITAL_COMMUNITY)
Admission: EM | Admit: 2020-02-26 | Discharge: 2020-02-26 | Disposition: A | Discharge status: Home / Self Care | Payer: Self-pay | Attending: Emergency Medicine | Admitting: Emergency Medicine

## 2020-02-26 ENCOUNTER — Other Ambulatory Visit: Payer: Self-pay

## 2020-02-26 ENCOUNTER — Encounter (HOSPITAL_COMMUNITY): Payer: Self-pay | Admitting: Emergency Medicine

## 2020-02-26 DIAGNOSIS — F10129 Alcohol abuse with intoxication, unspecified: Secondary | ICD-10-CM | POA: Insufficient documentation

## 2020-02-26 DIAGNOSIS — Z5321 Procedure and treatment not carried out due to patient leaving prior to being seen by health care provider: Secondary | ICD-10-CM | POA: Insufficient documentation
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
